# Patient Record
Sex: Male | Born: 1975 | Race: White | Hispanic: No | Marital: Single | Smoking: Current every day smoker
Health system: Southern US, Community
[De-identification: ages and names within clinical notes are randomized; demographics above are authoritative.]

## PROBLEM LIST (undated history)

## (undated) DIAGNOSIS — T7840XA Allergy, unspecified, initial encounter: Secondary | ICD-10-CM

## (undated) DIAGNOSIS — L0293 Carbuncle, unspecified: Secondary | ICD-10-CM

## (undated) DIAGNOSIS — Z720 Tobacco use: Secondary | ICD-10-CM

## (undated) DIAGNOSIS — L0292 Furuncle, unspecified: Secondary | ICD-10-CM

## (undated) DIAGNOSIS — F988 Other specified behavioral and emotional disorders with onset usually occurring in childhood and adolescence: Secondary | ICD-10-CM

## (undated) HISTORY — DX: Other specified behavioral and emotional disorders with onset usually occurring in childhood and adolescence: F98.8

## (undated) HISTORY — DX: Furuncle, unspecified: L02.92

## (undated) HISTORY — DX: Tobacco use: Z72.0

## (undated) HISTORY — DX: Carbuncle, unspecified: L02.93

## (undated) HISTORY — DX: Allergy, unspecified, initial encounter: T78.40XA

---

## 2006-08-02 ENCOUNTER — Emergency Department (HOSPITAL_COMMUNITY): Admission: EM | Admit: 2006-08-02 | Discharge: 2006-08-02 | Payer: Self-pay | Admitting: Emergency Medicine

## 2006-11-20 ENCOUNTER — Ambulatory Visit: Payer: Self-pay | Admitting: Family Medicine

## 2006-11-20 DIAGNOSIS — F17200 Nicotine dependence, unspecified, uncomplicated: Secondary | ICD-10-CM

## 2006-11-20 DIAGNOSIS — F988 Other specified behavioral and emotional disorders with onset usually occurring in childhood and adolescence: Secondary | ICD-10-CM | POA: Insufficient documentation

## 2006-12-04 ENCOUNTER — Ambulatory Visit: Payer: Self-pay | Admitting: Family Medicine

## 2007-01-07 ENCOUNTER — Telehealth: Payer: Self-pay | Admitting: Family Medicine

## 2007-04-22 ENCOUNTER — Ambulatory Visit: Payer: Self-pay | Admitting: Family Medicine

## 2007-04-22 ENCOUNTER — Telehealth: Payer: Self-pay | Admitting: Family Medicine

## 2007-04-22 LAB — CONVERTED CEMR LAB
ALT: 24 units/L (ref 0–53)
Alkaline Phosphatase: 46 units/L (ref 39–117)
BUN: 14 mg/dL (ref 6–23)
Basophils Relative: 0.1 % (ref 0.0–1.0)
Bilirubin Urine: NEGATIVE
CO2: 31 meq/L (ref 19–32)
Calcium: 9.5 mg/dL (ref 8.4–10.5)
Cholesterol: 204 mg/dL (ref 0–200)
Creatinine, Ser: 1 mg/dL (ref 0.4–1.5)
Eosinophils Relative: 1.9 % (ref 0.0–5.0)
Glucose, Bld: 85 mg/dL (ref 70–99)
Hemoglobin: 16.5 g/dL (ref 13.0–17.0)
Monocytes Absolute: 0.8 10*3/uL — ABNORMAL HIGH (ref 0.2–0.7)
Monocytes Relative: 6.3 % (ref 3.0–11.0)
Nitrite: NEGATIVE
Platelets: 159 10*3/uL (ref 150–400)
RDW: 11.9 % (ref 11.5–14.6)
Total Bilirubin: 1 mg/dL (ref 0.3–1.2)
Total CHOL/HDL Ratio: 4.4
Total Protein: 6.3 g/dL (ref 6.0–8.3)
Urobilinogen, UA: 0.2
VLDL: 12 mg/dL (ref 0–40)
WBC Urine, dipstick: NEGATIVE
WBC: 12 10*3/uL — ABNORMAL HIGH (ref 4.5–10.5)

## 2007-05-16 ENCOUNTER — Ambulatory Visit: Payer: Self-pay | Admitting: Family Medicine

## 2007-05-16 DIAGNOSIS — L0203 Carbuncle of face: Secondary | ICD-10-CM

## 2007-05-16 DIAGNOSIS — L0202 Furuncle of face: Secondary | ICD-10-CM | POA: Insufficient documentation

## 2007-05-20 ENCOUNTER — Ambulatory Visit: Payer: Self-pay | Admitting: Family Medicine

## 2007-05-22 ENCOUNTER — Ambulatory Visit: Payer: Self-pay | Admitting: Family Medicine

## 2007-08-21 ENCOUNTER — Ambulatory Visit: Payer: Self-pay | Admitting: Family Medicine

## 2007-08-21 DIAGNOSIS — L723 Sebaceous cyst: Secondary | ICD-10-CM

## 2007-08-29 ENCOUNTER — Ambulatory Visit: Payer: Self-pay | Admitting: Family Medicine

## 2007-12-25 ENCOUNTER — Telehealth: Payer: Self-pay | Admitting: Family Medicine

## 2007-12-27 ENCOUNTER — Telehealth: Payer: Self-pay | Admitting: Family Medicine

## 2008-05-29 IMAGING — CT CT PELVIS W/O CM
1 series · 15 of 32 positions shown, 19 images · IV contrast (agent unspecified)
Comparison: none

CLINICAL DATA: Right renal colic.  Nausea and vomiting.
 ABDOMEN CT WITHOUT CONTRAST:
TECHNIQUE: Multidetector CT imaging of the abdomen was performed following the standard protocol without IV contrast.
TECHNIQUE: Multidetector CT imaging of the pelvis was performed following the standard protocol without IV contrast.

[Series 2: stone_wo 5.0 b40f st · axial · 0.74mm/px · z∈[-535,-175]mm · 15 of 101 slices shown, 19 images]
[im 7/101  soft-tissue]
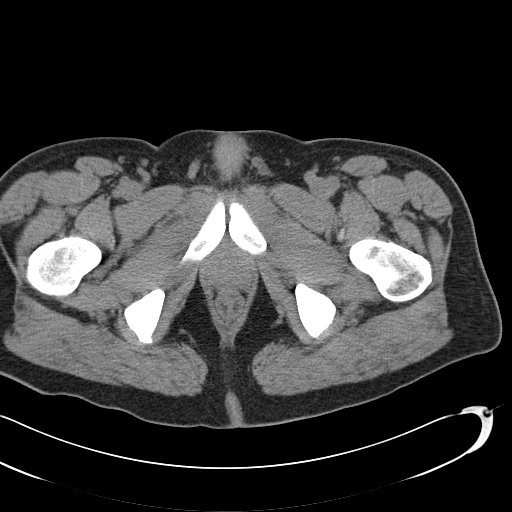
[im 7/101  bone]
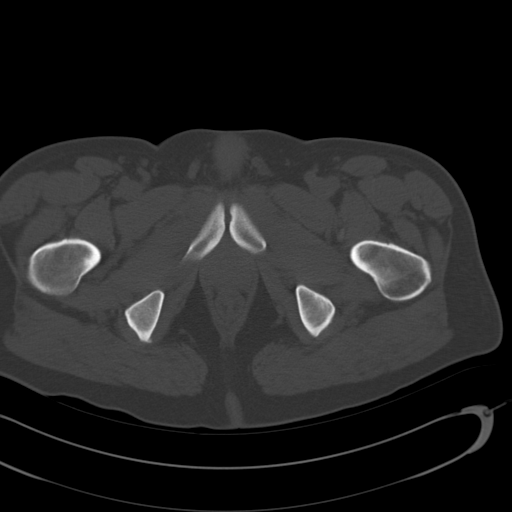
[im 13/101  soft-tissue]
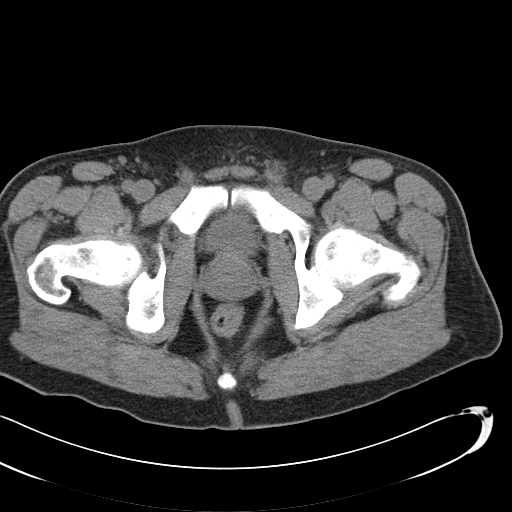
[im 20/101  soft-tissue]
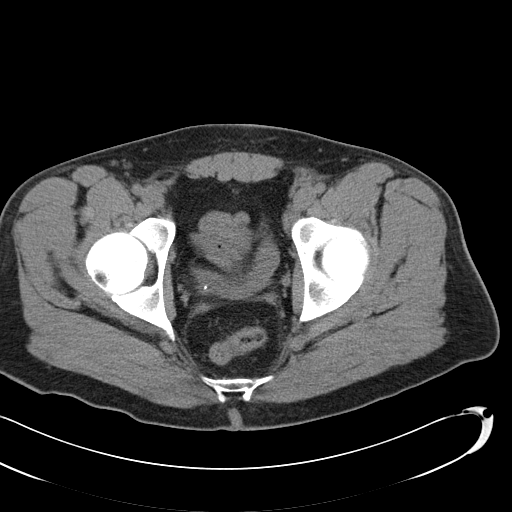
[im 30/101  soft-tissue]
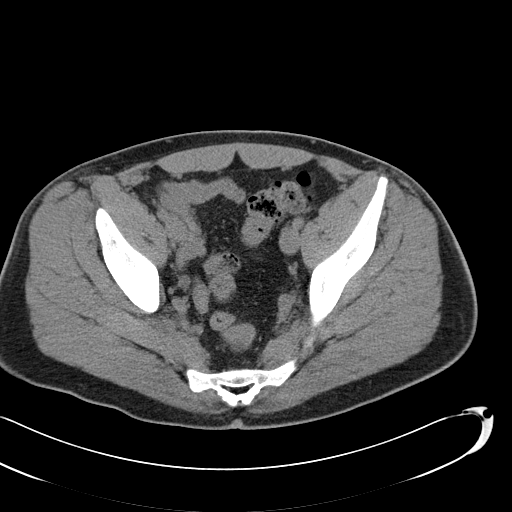
[im 36/101  soft-tissue]
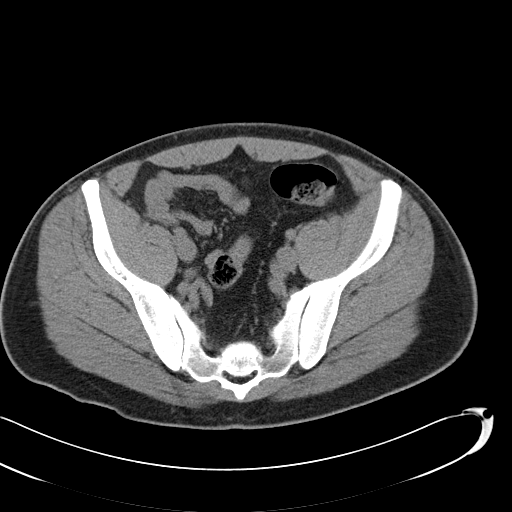
[im 42/101  soft-tissue]
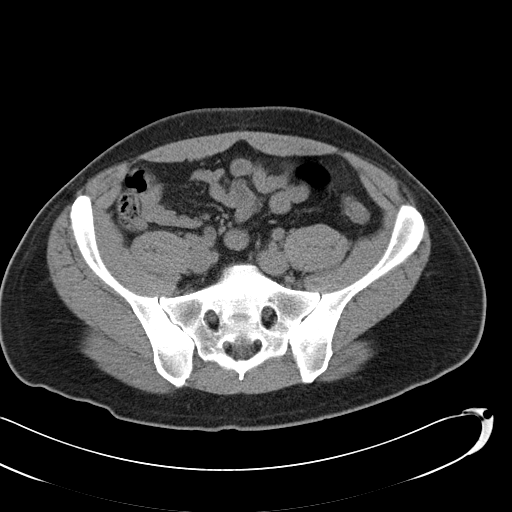
[im 52/101  soft-tissue]
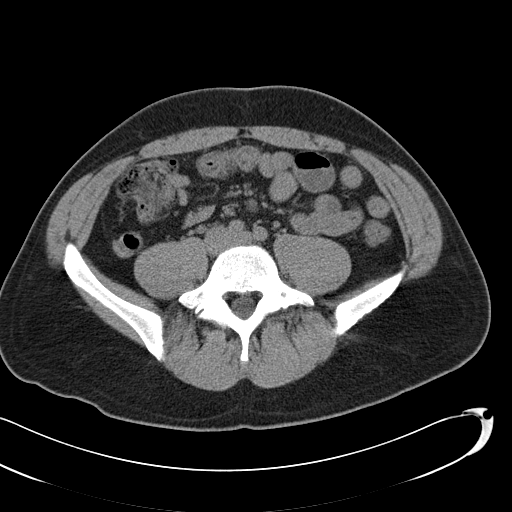
[im 59/101  soft-tissue]
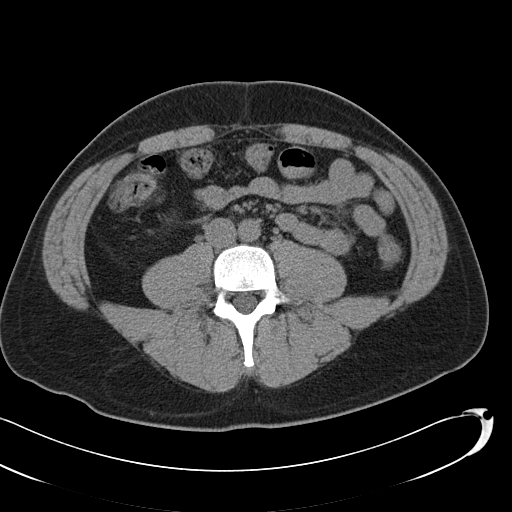
[im 65/101  soft-tissue]
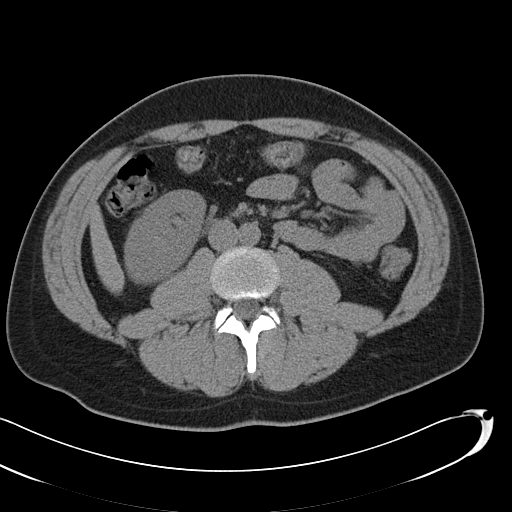
[im 65/101  bone]
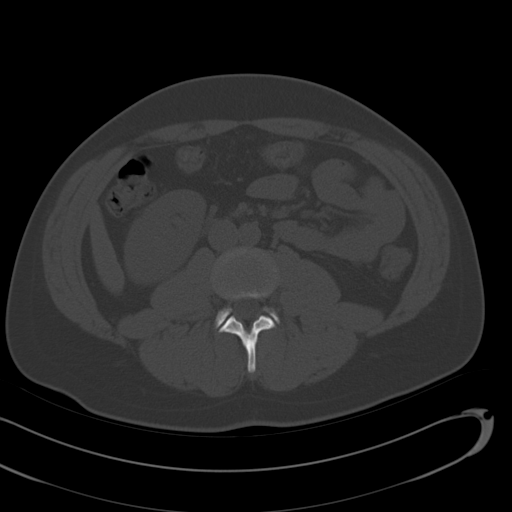
[im 71/101  soft-tissue]
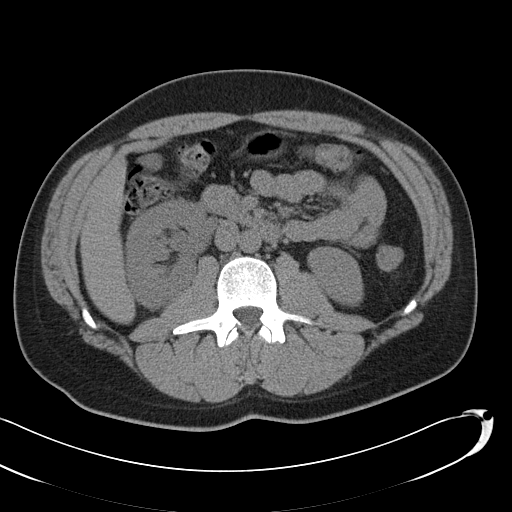
[im 81/101  soft-tissue]
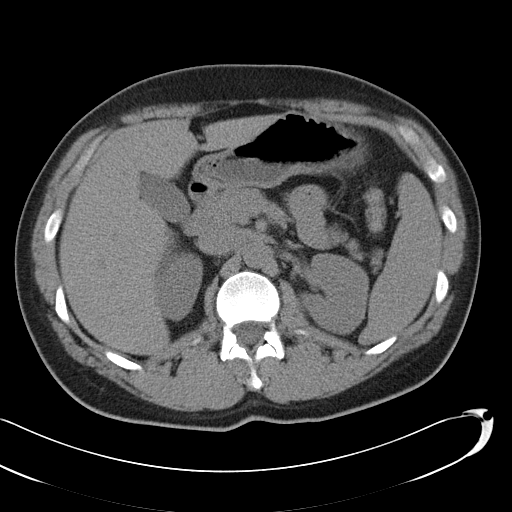
[im 88/101  soft-tissue]
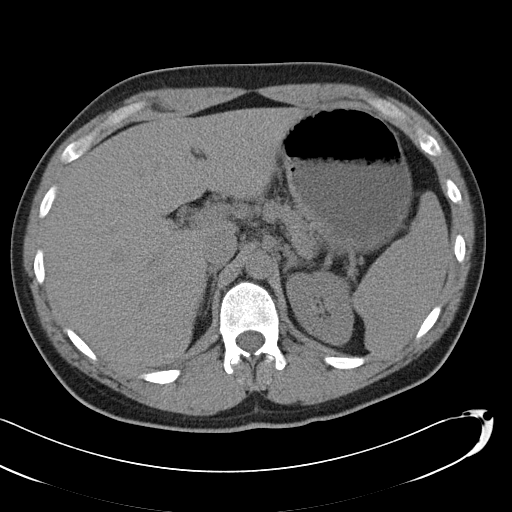
[im 88/101  lung]
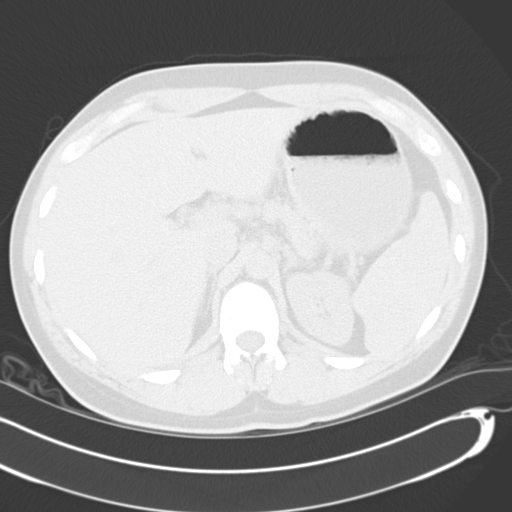
[im 91/101  lung]
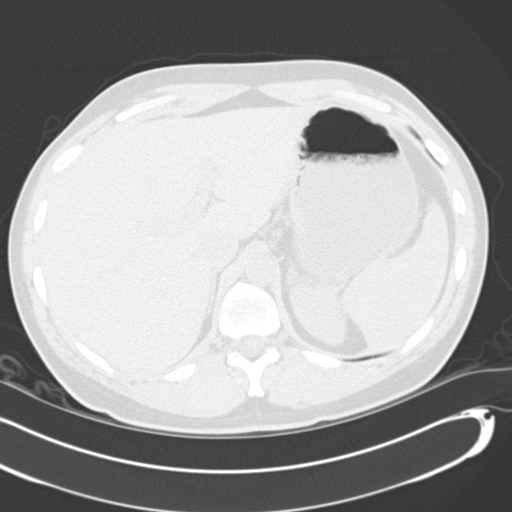
[im 94/101  soft-tissue]
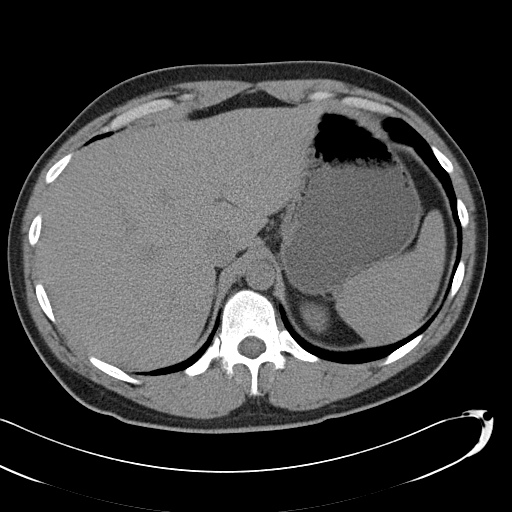
[im 94/101  lung]
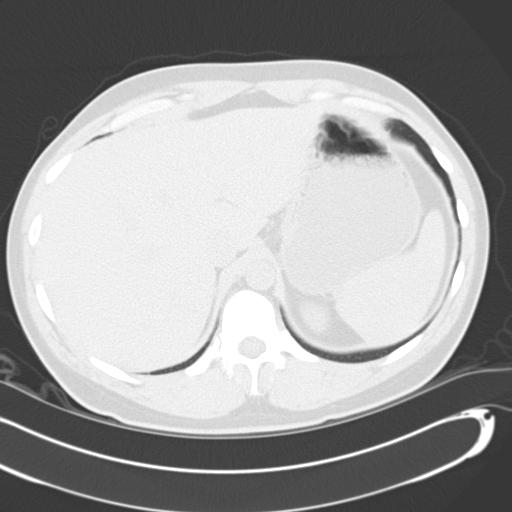
[im 97/101  lung]
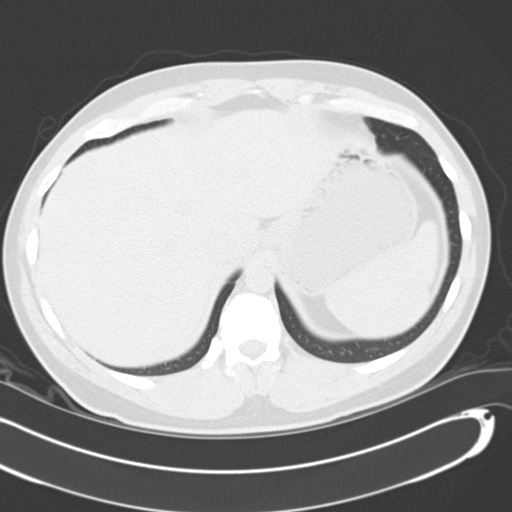

[15 of 32 positions shown; findings below may reference images not displayed]

FINDINGS: Mild right hydronephrosis and ureterectasis is seen. There is no evidence of left-sided hydronephrosis.  Both kidneys are normal in contour and there is no evidence of peripheral fluid or inflammatory changes.  
 The other abdominal parenchymal organs are unremarkable in appearance on this noncontrast study.  There is no evidence of a mass or adenopathy.  There is no evidence of an inflammatory process or abnormal fluid collections.  Unopacified bowel loops are unremarkable.  Surgical clips are noted from prior appendectomy.
IMPRESSION: Mild right hydronephrosis and ureterectasis.  See pelvis CT report below.
 PELVIS CT WITHOUT CONTRAST:
FINDINGS: Mild to moderate right ureteral dilatation is seen to the level of the bladder. There is a 2 mm calculus at the right ureterovesical junction.  
 There is no evidence of a pelvic mass. There is no evidence of an inflammatory process or abnormal fluid collections.  Nonopacified bowel loops are unremarkable in appearance.
IMPRESSION: 2 mm calculus at the right ureterovesical junction.  This results in mild right hydroureteronephrosis.

## 2008-06-25 ENCOUNTER — Telehealth: Payer: Self-pay | Admitting: Family Medicine

## 2008-10-07 ENCOUNTER — Ambulatory Visit: Payer: Self-pay | Admitting: Family Medicine

## 2008-10-09 ENCOUNTER — Ambulatory Visit: Payer: Self-pay | Admitting: Family Medicine

## 2009-08-09 ENCOUNTER — Telehealth: Payer: Self-pay | Admitting: *Deleted

## 2010-01-31 ENCOUNTER — Telehealth: Payer: Self-pay | Admitting: Family Medicine

## 2010-02-22 ENCOUNTER — Ambulatory Visit
Admission: RE | Admit: 2010-02-22 | Discharge: 2010-02-22 | Payer: Self-pay | Source: Home / Self Care | Attending: Family Medicine | Admitting: Family Medicine

## 2010-03-17 NOTE — Assessment & Plan Note (Signed)
Summary: med check and refill/cjr   Vital Signs:  Patient profile:   35 year old male Height:      72.5 inches Weight:      230 pounds BMI:     30.88 Temp:     97.5 degrees F oral BP sitting:   120 / 80  (left arm) Cuff size:   regular  Vitals Entered By: Kern Reap CMA Duncan Dull) (February 22, 2010 11:15 AM) CC: follow-up visit   CC:  follow-up visit.  History of Present Illness:  Shawnmichael is a 35 year old male, smoker, who comes in today for follow-up of ADD and to discuss smoking cessation.  He is currently on 20 mg of Adderall b.i.d. and functioning much better.  No side effects from medication or an  It one point he took chantix however, on a milligram twice daily.  He had vivid dreams.  We discussed decreasing the dose to half a tablet daily with a taper of nicotine  Allergies: No Known Drug Allergies  Past History:  Past medical, surgical, family and social histories (including risk factors) reviewed for relevance to current acute and chronic problems.  Past Medical History: Reviewed history from 05/16/2007 and no changes required. ADD tobacco abuse allergic rhinitis  recurrent boils  Family History: Reviewed history and no changes required.  Social History: Reviewed history from 11/20/2006 and no changes required. Occupation:remodling Single Current Smoker Alcohol use-yes Drug use-no Regular exercise-yes  Review of Systems      See HPI  Physical Exam  General:  Well-developed,well-nourished,in no acute distress; alert,appropriate and cooperative throughout examination   Impression & Recommendations:  Problem # 1:  TOBACCO ABUSE (ICD-305.1) Assessment Unchanged  His updated medication list for this problem includes:    Chantix Continuing Month Pak 1 Mg Tabs (Varenicline tartrate) ..... Uad  Orders: Tobacco use cessation intermediate 3-10 minutes (16109)  Problem # 2:  ADD (ICD-314.00) Assessment: Improved  Orders: Tobacco use cessation  intermediate 3-10 minutes (99406)  Complete Medication List: 1)  Amphetamine-dextroamphetamine 20 Mg Tabs (Amphetamine-dextroamphetamine) .... Take 1 tablet by mouth two times a day 2)  Amphetamine-dextroamphetamine 20 Mg Tabs (Amphetamine-dextroamphetamine) .... Take 1 tablet by mouth two times a day 3)  Amphetamine-dextroamphetamine 20 Mg Tabs (Amphetamine-dextroamphetamine) .... Take 1 tablet by mouth two times a day 4)  Chantix Continuing Month Pak 1 Mg Tabs (Varenicline tartrate) .... Uad  Patient Instructions: 1)  restart the chantix by taking a half a tablet daily in the morning and tapering 20......... 15.............Marland Kitchen 10....... 5......4 ........... 3........ 2...Marland KitchenMarland Kitchen. one.........off 2)  Return in two months for follow-up. 3)  Continue the Adderall 20 mg twice daily Prescriptions: AMPHETAMINE-DEXTROAMPHETAMINE 20 MG TABS (AMPHETAMINE-DEXTROAMPHETAMINE) Take 1 tablet by mouth two times a day  #60 x 0   Entered and Authorized by:   Roderick Pee MD   Signed by:   Roderick Pee MD on 02/22/2010   Method used:   Print then Give to Patient   RxID:   [id number] AMPHETAMINE-DEXTROAMPHETAMINE 20 MG TABS (AMPHETAMINE-DEXTROAMPHETAMINE) Take 1 tablet by mouth two times a day  #60 x 0   Entered and Authorized by:   Roderick Pee MD   Signed by:   Roderick Pee MD on 02/22/2010   Method used:   Print then Give to Patient   RxID:   [id number] AMPHETAMINE-DEXTROAMPHETAMINE 20 MG TABS (AMPHETAMINE-DEXTROAMPHETAMINE) Take 1 tablet by mouth two times a day  #60 x 0   Entered and Authorized by:   Roderick Pee MD  Signed by:   Roderick Pee MD on 02/22/2010   Method used:   Print then Give to Patient   RxID:   [id number] AMPHETAMINE-DEXTROAMPHETAMINE 20 MG TABS (AMPHETAMINE-DEXTROAMPHETAMINE) take one tab three times a day   fill in two months  #60 x 0   Entered and Authorized by:   Roderick Pee MD   Signed by:   Roderick Pee MD on 02/22/2010   Method used:   Print  then Give to Patient   RxID:   [id number] AMPHETAMINE-DEXTROAMPHETAMINE 20 MG TABS (AMPHETAMINE-DEXTROAMPHETAMINE) take one tab three times a day  fill in one month  #60 x 0   Entered and Authorized by:   Roderick Pee MD   Signed by:   Roderick Pee MD on 02/22/2010   Method used:   Print then Give to Patient   RxID:   [id number] AMPHETAMINE-DEXTROAMPHETAMINE 20 MG TABS (AMPHETAMINE-DEXTROAMPHETAMINE) take one tab three times a day  #60 x 0   Entered and Authorized by:   Roderick Pee MD   Signed by:   Roderick Pee MD on 02/22/2010   Method used:   Print then Give to Patient   RxID:   [id number] CHANTIX CONTINUING MONTH PAK 1 MG TABS (VARENICLINE TARTRATE) UAD  #1 x 2   Entered and Authorized by:   Roderick Pee MD   Signed by:   Roderick Pee MD on 02/22/2010   Method used:   Print then Give to Patient   RxID:   [id number]    Orders Added: 1)  Est. Patient Level IV [66063] 2)  Tobacco use cessation intermediate 3-10 minutes [99406]

## 2010-03-17 NOTE — Progress Notes (Signed)
Summary: Pt req scripts for Adderall 20mg  x 3  Phone Note Refill Request Call back at [facility] Phone [phone] Message from:  Patient on January 31, 2010 11:37 AM  Refills Requested: Medication #1:  AMPHETAMINE-DEXTROAMPHETAMINE 20 MG TABS take one tab three times a day   Dosage confirmed as above?Dosage Confirmed   Supply Requested: 3 months  Method Requested: Pick up at Office Initial call taken by: Lucy Antigua,  January 31, 2010 11:37 AM  Follow-up for Phone Call        last office visit 8/10 Follow-up by: Kern Reap CMA Duncan Dull),  January 31, 2010 12:01 PM  Additional Follow-up for Phone Call Additional follow up Details #1::        a 30 day supply......... he needs  office visit, the first full week after the new year for a 30 minute appointment Additional Follow-up by: Roderick Pee MD,  January 31, 2010 12:40 PM    Prescriptions: AMPHETAMINE-DEXTROAMPHETAMINE 20 MG TABS (AMPHETAMINE-DEXTROAMPHETAMINE) take one tab three times a day  #90 x 0   Entered by:   Kern Reap CMA (AAMA)   Authorized by:   Roderick Pee MD   Signed by:   Kern Reap CMA (AAMA) on 01/31/2010   Method used:   Print then Give to Patient   RxID:   [id number]

## 2010-03-17 NOTE — Progress Notes (Signed)
Summary: Pt req script for Amphetamine Dextroamphetamine 20mg   Phone Note Refill Request Call back at [phone] cell   Refills Requested: Medication #1:  AMPHETAMINE-DEXTROAMPHETAMINE 20 MG TABS take one tab three times a day   Supply Requested: 3 months  Method Requested: Pick up at Office Initial call taken by: Lucy Antigua,  August 09, 2009 4:27 PM    Prescriptions: AMPHETAMINE-DEXTROAMPHETAMINE 20 MG TABS (AMPHETAMINE-DEXTROAMPHETAMINE) take one tab three times a day   fill in two months  #90 x 0   Entered by:   Kern Reap CMA (AAMA)   Authorized by:   Gordy Savers  MD   Signed by:   Kern Reap CMA (AAMA) on 08/09/2009   Method used:   Print then Give to Patient   RxID:   [id number] AMPHETAMINE-DEXTROAMPHETAMINE 20 MG TABS (AMPHETAMINE-DEXTROAMPHETAMINE) take one tab three times a day  fill in one month  #90 x 0   Entered by:   Kern Reap CMA (AAMA)   Authorized by:   Gordy Savers  MD   Signed by:   Kern Reap CMA (AAMA) on 08/09/2009   Method used:   Print then Give to Patient   RxID:   [id number] AMPHETAMINE-DEXTROAMPHETAMINE 20 MG TABS (AMPHETAMINE-DEXTROAMPHETAMINE) take one tab three times a day  #90 x 0   Entered by:   Kern Reap CMA (AAMA)   Authorized by:   Gordy Savers  MD   Signed by:   Kern Reap CMA (AAMA) on 08/09/2009   Method used:   Print then Give to Patient   RxID:   [id number]

## 2010-06-14 ENCOUNTER — Other Ambulatory Visit: Payer: Self-pay | Admitting: Family Medicine

## 2010-06-14 MED ORDER — AMPHETAMINE-DEXTROAMPHETAMINE 20 MG PO TABS: 20.0000 mg | ORAL_TABLET | Freq: Two times a day (BID) | ORAL | Status: DC

## 2010-06-14 NOTE — Telephone Encounter (Signed)
rx ready for pick up and patient is aware  

## 2010-06-14 NOTE — Telephone Encounter (Signed)
Pt needs new rx generic adderall 20 mg °

## 2010-10-06 ENCOUNTER — Telehealth: Payer: Self-pay | Admitting: Family Medicine

## 2010-10-06 MED ORDER — AMPHETAMINE-DEXTROAMPHETAMINE 20 MG PO TABS: 20.0000 mg | ORAL_TABLET | Freq: Two times a day (BID) | ORAL | Status: DC

## 2010-10-06 NOTE — Telephone Encounter (Signed)
Pt is requesting refill on amphetamine-dextroamphetamine (ADDERALL, 20MG ,) 20 MG tablet  Pt is out of medication. Pease contact when ready to pick up.

## 2010-10-06 NOTE — Telephone Encounter (Signed)
rx ready for pick up and patient is aware  

## 2010-11-03 ENCOUNTER — Telehealth: Payer: Self-pay | Admitting: Family Medicine

## 2010-11-03 ENCOUNTER — Ambulatory Visit (INDEPENDENT_AMBULATORY_CARE_PROVIDER_SITE_OTHER): Payer: BC Managed Care – PPO | Admitting: Family Medicine

## 2010-11-03 ENCOUNTER — Encounter: Payer: Self-pay | Admitting: Family Medicine

## 2010-11-03 VITALS — BP 118/70 | Temp 98.4°F | Wt 237.0 lb

## 2010-11-03 DIAGNOSIS — L0231 Cutaneous abscess of buttock: Secondary | ICD-10-CM | POA: Insufficient documentation

## 2010-11-03 DIAGNOSIS — L03317 Cellulitis of buttock: Secondary | ICD-10-CM

## 2010-11-03 MED ORDER — HEXACHLOROPHENE 3 % EX LIQD: Freq: Once | CUTANEOUS | Status: DC

## 2010-11-03 MED ORDER — CEPHALEXIN 500 MG PO CAPS: 500.0000 mg | ORAL_CAPSULE | Freq: Four times a day (QID) | ORAL | Status: AC

## 2010-11-03 NOTE — Progress Notes (Signed)
  Subjective:    Patient ID: Jason Wall Wall, male    DOB: 1975/06/20, 35 y.o.   MRN: [id number]  HPIJason is a 35 year old, married male, who comes in today for treatment of an abscess on his left box.  He said two prior episodes.  Both of which had to be IND.  This is a large egg-sized lesion left butttocks   He was taken to the treatment room.  The area was cleaned with alcohol and anesthetized with 1% Xylocaine with epinephrine.  An incision was made.  The pus was drained.  It was packed.  Dry sterile dressing was applied.  He tolerated the procedure well.  No complications.    Review of Systems    Review of systems negative Objective:   Physical Exam Procedure see above       Assessment & Plan:  Boil left buttocks drained surgically

## 2010-11-03 NOTE — Patient Instructions (Signed)
Take the Keflex, two tabs twice daily to bottle empty.  Return on Tuesday to have the packing removed.  If prior to Tuesday.  The packing comes out spontaneously, then begin to clean the area 4 times daily with a Q-tip and a ointment Once this is completely healed, then we will start the pHisoHex soap scrubs 3 times weekly for two months

## 2010-11-03 NOTE — Telephone Encounter (Signed)
Chart opened in error

## 2010-11-08 ENCOUNTER — Encounter: Payer: Self-pay | Admitting: Family Medicine

## 2010-11-08 ENCOUNTER — Ambulatory Visit (INDEPENDENT_AMBULATORY_CARE_PROVIDER_SITE_OTHER): Payer: BC Managed Care – PPO | Admitting: Family Medicine

## 2010-11-08 DIAGNOSIS — L0231 Cutaneous abscess of buttock: Secondary | ICD-10-CM

## 2010-11-08 DIAGNOSIS — Z23 Encounter for immunization: Secondary | ICD-10-CM

## 2010-11-08 NOTE — Patient Instructions (Signed)
Clean the cavity twice daily with a Q-tip and to be adequate.  Medications return p.r.n.

## 2010-11-08 NOTE — Progress Notes (Signed)
  Subjective:    Patient ID: Jason Wall, male    DOB: 1975-11-03, 35 y.o.   MRN: [id number]  HPI  [name] is a 35 year old male, who comes in today for removal of packing.  We indeed, a boil on his left [place] last week.  He comes back today to have the packing removed.  Review of Systems    Negative Objective:   Physical Exam   Well-developed well-nourished man in acute distress.  The packing was removed.  The boil cavity was cleaned with a Q-tip and Neosporin and he was instructed on how to do this at home until completely healed     Assessment & Plan:  Boil resolved.  Return p.r.n.

## 2010-11-28 ENCOUNTER — Ambulatory Visit (INDEPENDENT_AMBULATORY_CARE_PROVIDER_SITE_OTHER): Payer: BC Managed Care – PPO | Admitting: Family Medicine

## 2010-11-28 ENCOUNTER — Encounter: Payer: Self-pay | Admitting: Family Medicine

## 2010-11-28 DIAGNOSIS — F17200 Nicotine dependence, unspecified, uncomplicated: Secondary | ICD-10-CM

## 2010-11-28 DIAGNOSIS — J45909 Unspecified asthma, uncomplicated: Secondary | ICD-10-CM

## 2010-11-28 DIAGNOSIS — J45901 Unspecified asthma with (acute) exacerbation: Secondary | ICD-10-CM | POA: Insufficient documentation

## 2010-11-28 MED ORDER — VARENICLINE TARTRATE 1 MG PO TABS: 1.0000 mg | ORAL_TABLET | Freq: Two times a day (BID) | ORAL | Status: DC

## 2010-11-28 MED ORDER — HYDROCODONE-HOMATROPINE 5-1.5 MG/5ML PO SYRP: ORAL_SOLUTION | ORAL | Status: DC

## 2010-11-28 MED ORDER — PREDNISONE 20 MG PO TABS: ORAL_TABLET | ORAL | Status: DC

## 2010-11-28 NOTE — Patient Instructions (Signed)
No smoking.  Chantix one half tab daily in the morning.  Drink lots of water.  Prednisone 3 tabs now then take as directed.  Hydromet one half to 1 teaspoon at bedtime as needed for cough.  Follow-up in one week, sooner if any problems

## 2010-11-28 NOTE — Progress Notes (Signed)
  Subjective:    Patient ID: Jason Wall Wall, male    DOB: March 04, 1975, 35 y.o.   MRN: [id number]  HPIJason is a 35 year old single male, smoker......... One pack a day....... Who comes in today with a 4-day history of fever, chills, cough, and today some hemoptysis.  He states on Friday he developed fever, chills, and cough.  He coughs so hard this morning coughed up some bright red blood.  He continues to smoke a pack of cigarettes a day.    Review of Systems General pulmonary review of systems otherwise negative   Objective:   Physical Exam  Well-developed well-nourished, male in no acute distress.  Examination of the HEENT were negative.  Neck was supple.  No adenopathy.  Lungs show symmetrical.  Breath sounds bilateral wheezing mild to moderate inspiratory rate 12 unlabored      Assessment & Plan:  Viral syndrome with secondary asthma.  Tobacco abuse.  Plan........Marland Kitchen DC smoking, chantix one half tab daily, prednisone burst and taper, drink lots of water, Hydromet p.r.n. For cough, follow-up in one week

## 2010-12-05 ENCOUNTER — Ambulatory Visit (INDEPENDENT_AMBULATORY_CARE_PROVIDER_SITE_OTHER): Payer: BC Managed Care – PPO | Admitting: Family Medicine

## 2010-12-05 ENCOUNTER — Encounter: Payer: Self-pay | Admitting: Family Medicine

## 2010-12-05 DIAGNOSIS — J45901 Unspecified asthma with (acute) exacerbation: Secondary | ICD-10-CM

## 2010-12-05 DIAGNOSIS — F17200 Nicotine dependence, unspecified, uncomplicated: Secondary | ICD-10-CM

## 2010-12-05 DIAGNOSIS — J45909 Unspecified asthma, uncomplicated: Secondary | ICD-10-CM

## 2010-12-05 NOTE — Patient Instructions (Signed)
Take one prednisone tablet daily for the next 5 days, then a half a tablet for 5 days, then a half a tablet Monday, Wednesday, Friday, for a two week taper.  Increase the chantix to a full tablet in  The morning and half a tab at bedtime.  Decreased y  smoking by two per day........... 15......... 13.......Marland Kitchen 11.......... Etc.  Return in two weeks for follow-up

## 2010-12-05 NOTE — Progress Notes (Signed)
  Subjective:    Patient ID: Jason Wall, male    DOB: 08-16-1975, 35 y.o.   MRN: [id number]  HPIJason is a 35 year old male, who comes in today for follow-up of asthma and tobacco abuse.  We start him on prednisone last week and chantix in a stander, half a tablet of prednisone a day.  Still wheezing, however, he still smoking.  He, says he's cut down is only smoking 10 to 12 cigarettes a day.  His chantix he has increased to half a tablet twice daily.  He is able to sleep or lie flat at night.    Review of Systems    General on pulmonary review of systems otherwise negative Objective:   Physical Exam  Well-developed well-nourished man in no acute distress.  HEENT negative.  Neck was supple.  No adenopathy.  Lungs show symmetrical.  Breath sounds bilateral wheezing, expiratory 1+      Assessment & Plan:  Asthma and tobacco abuse.  Plan stop smoking completely.  Prednisone one tablet daily for 5 days, then half a tab for 5 days, then begin tapering.  Increase the chantix to a full tablet in the morning and half a tablet at bedtime.  Follow-up in two weeks

## 2010-12-19 ENCOUNTER — Ambulatory Visit: Payer: BC Managed Care – PPO | Admitting: Family Medicine

## 2011-01-20 ENCOUNTER — Other Ambulatory Visit: Payer: Self-pay | Admitting: Family Medicine

## 2011-01-20 MED ORDER — AMPHETAMINE-DEXTROAMPHETAMINE 20 MG PO TABS: 20.0000 mg | ORAL_TABLET | Freq: Two times a day (BID) | ORAL | Status: DC

## 2011-01-20 NOTE — Telephone Encounter (Signed)
rx ready for pick up.  Appointment offered today but patient refused.  He will call back if no improvement over the weekend.

## 2011-01-20 NOTE — Telephone Encounter (Signed)
Pt requesting refill on amphetamine-dextroamphetamine (ADDERALL) 20 MG tablet.  Pt is also still experiencing about of sinus and chest congestion pt has been seen for his in November and it is still staying with him. Pt wanted to know if he needs to be seen again or if an rx could be called in for him. Pt requesting you contact him.

## 2011-04-28 ENCOUNTER — Other Ambulatory Visit: Payer: Self-pay | Admitting: Family Medicine

## 2011-04-28 MED ORDER — HEXACHLOROPHENE 3 % EX LIQD: Freq: Once | CUTANEOUS | Status: DC

## 2011-04-28 MED ORDER — AMPHETAMINE-DEXTROAMPHETAMINE 20 MG PO TABS: 20.0000 mg | ORAL_TABLET | Freq: Two times a day (BID) | ORAL | Status: DC

## 2011-04-28 NOTE — Telephone Encounter (Signed)
Pt needs new rx generic adderall 20 mg. Pt has 2 pills left 

## 2011-04-28 NOTE — Telephone Encounter (Signed)
Patient is calling for a refill of hydromet and prednisone for a dry cough is this okay to fill?

## 2011-05-01 MED ORDER — HYDROCODONE-HOMATROPINE 5-1.5 MG/5ML PO SYRP: 5.0000 mL | ORAL_SOLUTION | Freq: Four times a day (QID) | ORAL | Status: DC | PRN

## 2011-05-01 MED ORDER — PREDNISONE 20 MG PO TABS: 20.0000 mg | ORAL_TABLET | Freq: Every day | ORAL | Status: DC

## 2011-05-01 NOTE — Telephone Encounter (Signed)
Addended by: Kern Reap B on: 05/01/2011 04:51 PM   Modules accepted: Orders

## 2011-05-01 NOTE — Telephone Encounter (Signed)
ok 

## 2011-05-01 NOTE — Telephone Encounter (Signed)
Rx sent and called in and Left message on machine for patient

## 2011-08-03 ENCOUNTER — Other Ambulatory Visit: Payer: Self-pay | Admitting: Family Medicine

## 2011-08-03 MED ORDER — AMPHETAMINE-DEXTROAMPHETAMINE 20 MG PO TABS: 20.0000 mg | ORAL_TABLET | Freq: Two times a day (BID) | ORAL | Status: DC

## 2011-08-03 NOTE — Telephone Encounter (Signed)
Pt needs new rx generic adderall 20 mg. Pt has 2 pills left

## 2011-08-03 NOTE — Telephone Encounter (Signed)
Rx is ready for pick up and patient is aware. 

## 2011-11-17 ENCOUNTER — Telehealth: Payer: Self-pay | Admitting: Family Medicine

## 2011-11-17 NOTE — Telephone Encounter (Signed)
Patient called stating that he need a refill of his adderall as he is completely out. Please assist.

## 2011-11-18 MED ORDER — AMPHETAMINE-DEXTROAMPHETAMINE 20 MG PO TABS: 20.0000 mg | ORAL_TABLET | Freq: Two times a day (BID) | ORAL | Status: DC

## 2011-11-18 NOTE — Telephone Encounter (Signed)
rx ready for pick up and patient is aware  

## 2011-11-20 ENCOUNTER — Other Ambulatory Visit: Payer: Self-pay | Admitting: Family Medicine

## 2011-11-20 MED ORDER — AMPHETAMINE-DEXTROAMPHETAMINE 20 MG PO TABS: 20.0000 mg | ORAL_TABLET | Freq: Two times a day (BID) | ORAL | Status: DC

## 2011-12-19 ENCOUNTER — Telehealth: Payer: Self-pay | Admitting: Family Medicine

## 2011-12-19 NOTE — Telephone Encounter (Signed)
Patient called stating that he need a refill of his adderall 90 day supply. Please assist.

## 2011-12-20 MED ORDER — AMPHETAMINE-DEXTROAMPHETAMINE 20 MG PO TABS: 20.0000 mg | ORAL_TABLET | Freq: Two times a day (BID) | ORAL | Status: DC

## 2011-12-20 NOTE — Telephone Encounter (Signed)
rx ready for pick up. Left message on machine for patient. 

## 2011-12-25 ENCOUNTER — Telehealth: Payer: Self-pay | Admitting: Family Medicine

## 2011-12-25 MED ORDER — CHLORHEXIDINE GLUCONATE 4 % EX LIQD: 60.0000 mL | Freq: Every day | CUTANEOUS | Status: DC | PRN

## 2011-12-25 NOTE — Telephone Encounter (Signed)
Fleet Contras please call and find out if there is a substitute for pHisoHex

## 2011-12-25 NOTE — Telephone Encounter (Signed)
Pt is asking for RX written 04/26/11 for PHISOHEX liquid.  Pharmacist states this has been discontinued. Pls submit substitute. Christinia @ [facility]: Phone: [phone]   Fax:[phone]

## 2012-04-02 ENCOUNTER — Telehealth: Payer: Self-pay | Admitting: Family Medicine

## 2012-04-02 MED ORDER — AMPHETAMINE-DEXTROAMPHETAMINE 20 MG PO TABS: 20.0000 mg | ORAL_TABLET | Freq: Two times a day (BID) | ORAL | Status: DC

## 2012-04-02 NOTE — Telephone Encounter (Signed)
Last office visit was 12/05/10 okay to fill?

## 2012-04-02 NOTE — Telephone Encounter (Signed)
Rx ready for pick up and patient is aware and needs to make an appointment

## 2012-04-02 NOTE — Telephone Encounter (Signed)
Patient called stating that he need a refill of his adderall 20 mg 1po bid. Please assist.  °

## 2012-04-02 NOTE — Telephone Encounter (Signed)
Okay to refill medication for 1 month  He's an office visit in the next 30 days

## 2012-05-22 ENCOUNTER — Encounter: Payer: Self-pay | Admitting: Family Medicine

## 2012-05-22 ENCOUNTER — Ambulatory Visit (INDEPENDENT_AMBULATORY_CARE_PROVIDER_SITE_OTHER): Payer: BC Managed Care – PPO | Admitting: Family Medicine

## 2012-05-22 VITALS — BP 120/80 | Temp 98.3°F | Wt 230.0 lb

## 2012-05-22 DIAGNOSIS — F988 Other specified behavioral and emotional disorders with onset usually occurring in childhood and adolescence: Secondary | ICD-10-CM

## 2012-05-22 DIAGNOSIS — L0202 Furuncle of face: Secondary | ICD-10-CM

## 2012-05-22 MED ORDER — AMPHETAMINE-DEXTROAMPHETAMINE 20 MG PO TABS: 20.0000 mg | ORAL_TABLET | Freq: Two times a day (BID) | ORAL | Status: DC

## 2012-05-22 NOTE — Progress Notes (Signed)
  Subjective:    Patient ID: Jason Wall, male    DOB: 1976/01/17, 37 y.o.   MRN: [id number]  HPI Jason Wall is a 37 year old male who comes in today for evaluation of 2 problems  He takes Adderall 20 mg twice a day for adult ADD and seems to be doing very well with focus and concentration no side effects to medication  He has a sebaceous cyst on his right chin that we I&D however it it has not healed it continues to drain intermittently. At this juncture I recommend he see a specialist and have a wide excision   Review of Systems    review of systems otherwise negative Objective:   Physical Exam Well-developed well-nourished male no acute distress examination change shows a 1 inch scar from a previous I&D of a sebaceous cyst with active pus draining       Assessment & Plan:  Adult ADD continue Adderall 20 mg twice a day  Sebaceous cyst right chin consult with Dr. Narda Bonds ear nose and throat

## 2012-05-22 NOTE — Patient Instructions (Addendum)
Continue the Adderall 20 mg twice daily  Call 2 weeks from being out of your third prescription for refills  Consult with Dr. Dillard Cannon your nose and throat phone number [phone],,,,,,,, use my name when you call and tell them this is a sebaceous cyst that we previously I indeed however it's recurred and were requesting a consult for an excision

## 2012-08-22 ENCOUNTER — Telehealth: Payer: Self-pay | Admitting: Family Medicine

## 2012-08-22 DIAGNOSIS — F988 Other specified behavioral and emotional disorders with onset usually occurring in childhood and adolescence: Secondary | ICD-10-CM

## 2012-08-22 NOTE — Telephone Encounter (Signed)
PT calling to request a 3 month supply of amphetamine-dextroamphetamine (ADDERALL) 20 MG tablet. Please assist.

## 2012-08-26 ENCOUNTER — Other Ambulatory Visit: Payer: Self-pay | Admitting: *Deleted

## 2012-08-26 MED ORDER — AMPHETAMINE-DEXTROAMPHETAMINE 20 MG PO TABS: 20.0000 mg | ORAL_TABLET | Freq: Two times a day (BID) | ORAL | Status: DC

## 2012-08-26 NOTE — Telephone Encounter (Signed)
Rx ready for pick up and patient is aware 

## 2012-08-26 NOTE — Telephone Encounter (Signed)
Phisohex is no longer made.  I spoke with pharmacy and Hibiclens can be used and it is over the counter.  Insurance may not pay for it if prescribed. Left message on machine for patient.

## 2012-11-25 ENCOUNTER — Telehealth: Payer: Self-pay | Admitting: Family Medicine

## 2012-11-25 DIAGNOSIS — F988 Other specified behavioral and emotional disorders with onset usually occurring in childhood and adolescence: Secondary | ICD-10-CM

## 2012-11-25 MED ORDER — AMPHETAMINE-DEXTROAMPHETAMINE 20 MG PO TABS: 20.0000 mg | ORAL_TABLET | Freq: Two times a day (BID) | ORAL | Status: DC

## 2012-11-25 NOTE — Telephone Encounter (Signed)
Pt requesting refill of amphetamine-dextroamphetamine (ADDERALL) 20 MG tablet.  Please call when available for pick up.

## 2012-11-25 NOTE — Telephone Encounter (Signed)
Rx ready for pick up and Left message on machine for patient   

## 2013-02-27 ENCOUNTER — Telehealth: Payer: Self-pay | Admitting: Family Medicine

## 2013-02-27 DIAGNOSIS — F988 Other specified behavioral and emotional disorders with onset usually occurring in childhood and adolescence: Secondary | ICD-10-CM

## 2013-02-27 MED ORDER — AMPHETAMINE-DEXTROAMPHETAMINE 20 MG PO TABS: 20.0000 mg | ORAL_TABLET | Freq: Two times a day (BID) | ORAL | Status: DC

## 2013-02-27 NOTE — Telephone Encounter (Signed)
Pt needs new rxs generic adderall 20 mg

## 2013-02-28 NOTE — Telephone Encounter (Signed)
Left message on machine on machine for patient.  Rx ready for pick up.

## 2013-05-27 ENCOUNTER — Telehealth: Payer: Self-pay | Admitting: Family Medicine

## 2013-05-27 DIAGNOSIS — F988 Other specified behavioral and emotional disorders with onset usually occurring in childhood and adolescence: Secondary | ICD-10-CM

## 2013-05-27 MED ORDER — AMPHETAMINE-DEXTROAMPHETAMINE 20 MG PO TABS: 20.0000 mg | ORAL_TABLET | Freq: Two times a day (BID) | ORAL | Status: DC

## 2013-05-27 NOTE — Telephone Encounter (Signed)
Rx ready to pick up and Left message on machine. Office visit for more refills.

## 2013-05-27 NOTE — Telephone Encounter (Signed)
Pt request 3 mo supply of amphetamine-dextroamphetamine (ADDERALL) 20 MG tablet 1/bid

## 2013-12-10 ENCOUNTER — Telehealth: Payer: Self-pay | Admitting: Family Medicine

## 2013-12-10 DIAGNOSIS — F988 Other specified behavioral and emotional disorders with onset usually occurring in childhood and adolescence: Secondary | ICD-10-CM

## 2013-12-10 MED ORDER — AMPHETAMINE-DEXTROAMPHETAMINE 20 MG PO TABS: 20.0000 mg | ORAL_TABLET | Freq: Two times a day (BID) | ORAL | Status: DC

## 2013-12-10 NOTE — Telephone Encounter (Signed)
Rx ready for pick up and left message on machine for patient

## 2013-12-10 NOTE — Telephone Encounter (Signed)
Pt request refill amphetamine-dextroamphetamine (ADDERALL) 20 MG tablet Pt has made a cpe for 11/24.  30 days will get him through to his appt. pls advise

## 2013-12-30 ENCOUNTER — Other Ambulatory Visit (INDEPENDENT_AMBULATORY_CARE_PROVIDER_SITE_OTHER): Payer: BC Managed Care – PPO

## 2013-12-30 DIAGNOSIS — R790 Abnormal level of blood mineral: Secondary | ICD-10-CM

## 2013-12-30 DIAGNOSIS — Z0000 Encounter for general adult medical examination without abnormal findings: Secondary | ICD-10-CM

## 2013-12-30 LAB — BASIC METABOLIC PANEL
BUN: 9 mg/dL (ref 6–23)
CO2: 22 meq/L (ref 19–32)
Calcium: 9.5 mg/dL (ref 8.4–10.5)
Chloride: 106 mEq/L (ref 96–112)
Creatinine, Ser: 1 mg/dL (ref 0.4–1.5)
GFR: 85.6 mL/min (ref >60.00)
GLUCOSE: 105 mg/dL — AB (ref 70–99)
Potassium: 4.9 mEq/L (ref 3.5–5.1)
SODIUM: 143 meq/L (ref 135–145)

## 2013-12-30 LAB — HEPATIC FUNCTION PANEL
ALT: 27 U/L (ref 0–53)
AST: 26 U/L (ref 0–37)
Albumin: 4.6 g/dL (ref 3.5–5.2)
Alkaline Phosphatase: 61 U/L (ref 39–117)
Bilirubin, Direct: 0.1 mg/dL (ref 0.0–0.3)
Total Bilirubin: 0.9 mg/dL (ref 0.2–1.2)
Total Protein: 7.4 g/dL (ref 6.0–8.3)

## 2013-12-30 LAB — CBC WITH DIFFERENTIAL/PLATELET
Basophils Absolute: 0 10*3/uL (ref 0.0–0.1)
Basophils Relative: 0.2 % (ref 0.0–3.0)
EOS PCT: 0 % (ref 0.0–5.0)
Eosinophils Absolute: 0 10*3/uL (ref 0.0–0.7)
HEMATOCRIT: 47.1 % (ref 39.0–52.0)
HEMOGLOBIN: 16.1 g/dL (ref 13.0–17.0)
Lymphocytes Relative: 42.5 % (ref 12.0–46.0)
Lymphs Abs: 3.5 10*3/uL (ref 0.7–4.0)
MCHC: 34.2 g/dL (ref 30.0–36.0)
MCV: 88.1 fl (ref 78.0–100.0)
MONOS PCT: 5.3 % (ref 3.0–12.0)
Monocytes Absolute: 0.4 10*3/uL (ref 0.1–1.0)
NEUTROS ABS: 4.2 10*3/uL (ref 1.4–7.7)
Neutrophils Relative %: 52 % (ref 43.0–77.0)
Platelets: 185 10*3/uL (ref 150.0–400.0)
RBC: 5.34 Mil/uL (ref 4.22–5.81)
RDW: 13.1 % (ref 11.5–15.5)
WBC: 8.1 10*3/uL (ref 4.0–10.5)

## 2013-12-30 LAB — POCT URINALYSIS DIPSTICK
BILIRUBIN UA: NEGATIVE
Blood, UA: NEGATIVE
Glucose, UA: NEGATIVE
KETONES UA: NEGATIVE
LEUKOCYTES UA: NEGATIVE
NITRITE UA: NEGATIVE
PH UA: 7.5
PROTEIN UA: NEGATIVE
Spec Grav, UA: 1.015
Urobilinogen, UA: 0.2

## 2013-12-30 LAB — LIPID PANEL
Cholesterol: 219 mg/dL — ABNORMAL HIGH (ref 0–200)
HDL: 32.6 mg/dL — AB (ref >39.00)
NONHDL: 186.4
Total CHOL/HDL Ratio: 7
Triglycerides: 211 mg/dL — ABNORMAL HIGH (ref 0.0–149.0)
VLDL: 42.2 mg/dL — ABNORMAL HIGH (ref 0.0–40.0)

## 2013-12-30 LAB — TSH: TSH: 1.32 u[IU]/mL (ref 0.35–4.50)

## 2013-12-30 LAB — LDL CHOLESTEROL, DIRECT: Direct LDL: 153.4 mg/dL

## 2014-01-06 ENCOUNTER — Ambulatory Visit (INDEPENDENT_AMBULATORY_CARE_PROVIDER_SITE_OTHER): Payer: BC Managed Care – PPO | Admitting: *Deleted

## 2014-01-06 ENCOUNTER — Ambulatory Visit (INDEPENDENT_AMBULATORY_CARE_PROVIDER_SITE_OTHER): Payer: BC Managed Care – PPO | Admitting: Family Medicine

## 2014-01-06 ENCOUNTER — Encounter: Payer: Self-pay | Admitting: Family Medicine

## 2014-01-06 VITALS — BP 120/88 | Temp 98.1°F | Ht 72.25 in | Wt 238.0 lb

## 2014-01-06 DIAGNOSIS — F988 Other specified behavioral and emotional disorders with onset usually occurring in childhood and adolescence: Secondary | ICD-10-CM

## 2014-01-06 DIAGNOSIS — Z23 Encounter for immunization: Secondary | ICD-10-CM

## 2014-01-06 DIAGNOSIS — Z720 Tobacco use: Secondary | ICD-10-CM

## 2014-01-06 DIAGNOSIS — Z0000 Encounter for general adult medical examination without abnormal findings: Secondary | ICD-10-CM

## 2014-01-06 DIAGNOSIS — F17200 Nicotine dependence, unspecified, uncomplicated: Secondary | ICD-10-CM

## 2014-01-06 DIAGNOSIS — F900 Attention-deficit hyperactivity disorder, predominantly inattentive type: Secondary | ICD-10-CM

## 2014-01-06 MED ORDER — AMPHETAMINE-DEXTROAMPHETAMINE 20 MG PO TABS: 20.0000 mg | ORAL_TABLET | Freq: Two times a day (BID) | ORAL | Status: DC

## 2014-01-06 MED ORDER — VARENICLINE TARTRATE 1 MG PO TABS: ORAL_TABLET | ORAL | Status: AC

## 2014-01-06 MED ORDER — AMPHETAMINE-DEXTROAMPHET ER 20 MG PO CP24: 20.0000 mg | ORAL_CAPSULE | Freq: Two times a day (BID) | ORAL | Status: DC

## 2014-01-06 NOTE — Progress Notes (Signed)
Pre visit review using our clinic review tool, if applicable. No additional management support is needed unless otherwise documented below in the visit note. 

## 2014-01-06 NOTE — Progress Notes (Signed)
   Subjective:    Patient ID: Jason Wall, male    DOB: 19-Sep-1975, 38 y.o.   MRN: [id number]  HPI Jason CowerJason is a 38 year old single male nonsmoker who works for a [employer] company who comes in today for general physical examination  He's had a long-standing history of adult ADD and takes Adderall 20 mg short acting twice a day  He continues to smoke. At one time we had him on the Chantix program but he had some CNS side effects mild depression and stopped the medication. He would like to try to again  Recommended eye checks every 2-3 years, referred to Dr. Alvester MorinBell for dental care,,,,,,, colonoscopy at age 38 no family history of colon cancer polyps or GI problems.  Vaccinations updated by Fleet Contrasachel   Review of Systems  Constitutional: Negative.   HENT: Negative.   Eyes: Negative.   Respiratory: Negative.   Cardiovascular: Negative.   Gastrointestinal: Negative.   Endocrine: Negative.   Genitourinary: Negative.   Musculoskeletal: Negative.   Skin: Negative.   Allergic/Immunologic: Negative.   Neurological: Negative.   Hematological: Negative.   Psychiatric/Behavioral: Negative.        Objective:   Physical Exam  Constitutional: He is oriented to person, place, and time. He appears well-developed and well-nourished.  HENT:  Head: Normocephalic and atraumatic.  Right Ear: External ear normal.  Left Ear: External ear normal.  Nose: Nose normal.  Mouth/Throat: Oropharynx is clear and moist.  Eyes: Conjunctivae and EOM are normal. Pupils are equal, round, and reactive to light.  Neck: Normal range of motion. Neck supple. No JVD present. No tracheal deviation present. No thyromegaly present.  Cardiovascular: Normal rate, regular rhythm, normal heart sounds and intact distal pulses.  Exam reveals no gallop and no friction rub.   No murmur heard. Pulmonary/Chest: Effort normal and breath sounds normal. No stridor. No respiratory distress. He has no wheezes. He has no rales. He  exhibits no tenderness.  Abdominal: Soft. Bowel sounds are normal. He exhibits no distension and no mass. There is no tenderness. There is no rebound and no guarding.  Genitourinary: Penis normal. No penile tenderness.  Musculoskeletal: Normal range of motion. He exhibits no edema or tenderness.  Lymphadenopathy:    He has no cervical adenopathy.  Neurological: He is alert and oriented to person, place, and time. He has normal reflexes. No cranial nerve deficit. He exhibits normal muscle tone.  Skin: Skin is warm and dry. No rash noted. No erythema. No pallor.  Total body skin exam normal except a sebaceous cyst the right side of his neck the size of a negative. There is a scar right next to it were reexcised one a couple years ago. He's content to leave alone for now  Psychiatric: He has a normal mood and affect. His behavior is normal. Judgment and thought content normal.  Nursing note and vitals reviewed.         Assessment & Plan:  Healthy male  Adult ADD,,,,,,,,, continue Adderall 20 mg twice a day  Tobacco abuse,,,,,,,,,,, start the Chantix program........... follow-up in 6 weeks

## 2014-01-06 NOTE — Patient Instructions (Signed)
Chantix 1 mg......... one half tab every morning  Taper by 3 per week  Follow-up the second week in January

## 2014-01-07 ENCOUNTER — Telehealth: Payer: Self-pay | Admitting: Family Medicine

## 2014-01-07 NOTE — Telephone Encounter (Signed)
emmi mailed  °

## 2014-02-24 ENCOUNTER — Ambulatory Visit: Payer: BC Managed Care – PPO | Admitting: Family Medicine

## 2014-04-01 ENCOUNTER — Telehealth: Payer: Self-pay | Admitting: Family Medicine

## 2014-04-01 DIAGNOSIS — F988 Other specified behavioral and emotional disorders with onset usually occurring in childhood and adolescence: Secondary | ICD-10-CM

## 2014-04-01 NOTE — Telephone Encounter (Signed)
Okay to fill 04/08/14

## 2014-04-01 NOTE — Telephone Encounter (Signed)
Pt brought back rx  adderall xr 20 mg . Pt needs generic adderall 20 mg. Please advise

## 2014-04-06 MED ORDER — AMPHETAMINE-DEXTROAMPHETAMINE 20 MG PO TABS: 20.0000 mg | ORAL_TABLET | Freq: Two times a day (BID) | ORAL | Status: DC

## 2014-04-06 NOTE — Telephone Encounter (Signed)
rx ready for pick up and left message on machine 

## 2014-07-28 ENCOUNTER — Telehealth: Payer: Self-pay | Admitting: Family Medicine

## 2014-07-28 DIAGNOSIS — F988 Other specified behavioral and emotional disorders with onset usually occurring in childhood and adolescence: Secondary | ICD-10-CM

## 2014-07-28 NOTE — Telephone Encounter (Signed)
Patient called to get rx refilled adderall. Please call when ready for pick up.

## 2014-07-30 MED ORDER — AMPHETAMINE-DEXTROAMPHETAMINE 20 MG PO TABS: 20.0000 mg | ORAL_TABLET | Freq: Two times a day (BID) | ORAL | Status: DC

## 2014-07-30 NOTE — Telephone Encounter (Signed)
Rx ready for pick up and Left message on machine for patient   

## 2014-11-16 ENCOUNTER — Telehealth: Payer: Self-pay | Admitting: Family Medicine

## 2014-11-16 DIAGNOSIS — F988 Other specified behavioral and emotional disorders with onset usually occurring in childhood and adolescence: Secondary | ICD-10-CM

## 2014-11-16 NOTE — Telephone Encounter (Signed)
Pt needs new rx generic adderall 20 mg °

## 2014-11-17 MED ORDER — AMPHETAMINE-DEXTROAMPHETAMINE 20 MG PO TABS: 20.0000 mg | ORAL_TABLET | Freq: Two times a day (BID) | ORAL | Status: AC

## 2014-11-17 MED ORDER — AMPHETAMINE-DEXTROAMPHETAMINE 20 MG PO TABS: 20.0000 mg | ORAL_TABLET | Freq: Two times a day (BID) | ORAL | Status: DC

## 2014-11-17 NOTE — Telephone Encounter (Signed)
Rx ready for pick up and Left message on machine for patient   

## 2015-03-25 ENCOUNTER — Telehealth: Payer: Self-pay | Admitting: Family Medicine

## 2015-03-25 NOTE — Telephone Encounter (Signed)
Pt request refill amphetamine-dextroamphetamine (ADDERALL) 20 MG tablet °3 mo supply °

## 2015-03-26 MED ORDER — AMPHETAMINE-DEXTROAMPHETAMINE 20 MG PO TABS: 20.0000 mg | ORAL_TABLET | Freq: Two times a day (BID) | ORAL | Status: AC

## 2015-03-26 NOTE — Telephone Encounter (Signed)
rx ready for pick up and patient is aware.  Patient to make an appointment for more refills.
